# Patient Record
Sex: Female | Born: 1940
Health system: Southern US, Community
[De-identification: ages and names within clinical notes are randomized; demographics above are authoritative.]

## PROBLEM LIST (undated history)

## (undated) DIAGNOSIS — I10 Essential (primary) hypertension: Secondary | ICD-10-CM

## (undated) HISTORY — PX: CHOLECYSTECTOMY: SHX55

---

## 2017-08-24 ENCOUNTER — Encounter (HOSPITAL_BASED_OUTPATIENT_CLINIC_OR_DEPARTMENT_OTHER): Payer: Self-pay | Admitting: Emergency Medicine

## 2017-08-24 ENCOUNTER — Emergency Department (HOSPITAL_BASED_OUTPATIENT_CLINIC_OR_DEPARTMENT_OTHER)
Admission: EM | Admit: 2017-08-24 | Discharge: 2017-08-25 | Disposition: A | Discharge status: Home / Self Care | Payer: PPO | Attending: Emergency Medicine | Admitting: Emergency Medicine

## 2017-08-24 ENCOUNTER — Emergency Department (HOSPITAL_BASED_OUTPATIENT_CLINIC_OR_DEPARTMENT_OTHER): Payer: PPO

## 2017-08-24 DIAGNOSIS — S0031XA Abrasion of nose, initial encounter: Secondary | ICD-10-CM | POA: Insufficient documentation

## 2017-08-24 DIAGNOSIS — I10 Essential (primary) hypertension: Secondary | ICD-10-CM | POA: Diagnosis not present

## 2017-08-24 DIAGNOSIS — S0990XA Unspecified injury of head, initial encounter: Secondary | ICD-10-CM | POA: Diagnosis not present

## 2017-08-24 DIAGNOSIS — Z23 Encounter for immunization: Secondary | ICD-10-CM | POA: Insufficient documentation

## 2017-08-24 DIAGNOSIS — Y999 Unspecified external cause status: Secondary | ICD-10-CM | POA: Insufficient documentation

## 2017-08-24 DIAGNOSIS — S0993XA Unspecified injury of face, initial encounter: Secondary | ICD-10-CM | POA: Diagnosis not present

## 2017-08-24 DIAGNOSIS — Z79899 Other long term (current) drug therapy: Secondary | ICD-10-CM | POA: Diagnosis not present

## 2017-08-24 DIAGNOSIS — T148XXA Other injury of unspecified body region, initial encounter: Secondary | ICD-10-CM

## 2017-08-24 DIAGNOSIS — Y9222 Religious institution as the place of occurrence of the external cause: Secondary | ICD-10-CM | POA: Insufficient documentation

## 2017-08-24 DIAGNOSIS — Y9301 Activity, walking, marching and hiking: Secondary | ICD-10-CM | POA: Diagnosis not present

## 2017-08-24 DIAGNOSIS — W010XXA Fall on same level from slipping, tripping and stumbling without subsequent striking against object, initial encounter: Secondary | ICD-10-CM | POA: Insufficient documentation

## 2017-08-24 DIAGNOSIS — W19XXXA Unspecified fall, initial encounter: Secondary | ICD-10-CM

## 2017-08-24 HISTORY — DX: Essential (primary) hypertension: I10

## 2017-08-24 MED ORDER — TETANUS-DIPHTH-ACELL PERTUSSIS 5-2.5-18.5 LF-MCG/0.5 IM SUSP
0.5000 mL | Freq: Once | INTRAMUSCULAR | Status: AC
  Administered 2017-08-24: 0.5 mL via INTRAMUSCULAR
  Filled 2017-08-24: qty 0.5

## 2017-08-24 NOTE — ED Triage Notes (Signed)
Patient states that she fell earlier this AM  - she tripped on some rocks outside of church and hit her face and nose  - the patient reports that she hit her head as well. Patient is A/O x3

## 2017-08-24 NOTE — ED Provider Notes (Signed)
[facility]   CSN: [id number] Arrival date & time: 08/24/17  2100     History   Chief Complaint Chief Complaint  Patient presents with  . Fall    HPI Vickie Diaz is a 76 y.o. female.  HPI  Patient, with a past medical history of hypertension, presents to ED for evaluation ofpain after mechanical fall today. She was walking into church when she slipped on some rocks. She proceeded to complete her entire church service and then return to ED for evaluation. She denies any loss of consciousness. She denies any headache, blood thinners, vision changes, numbness in legs. She does report some soreness to her right leg where she landed.  Past Medical History:  Diagnosis Date  . Hypertension     There are no active problems to display for this patient.   Past Surgical History:  Procedure Laterality Date  . CHOLECYSTECTOMY      OB History    No data available       Home Medications    Prior to Admission medications   Medication Sig Start Date End Date Taking? Authorizing Provider  atenolol (TENORMIN) 100 MG tablet Take 100 mg by mouth daily.   Yes [provider]  hydrochlorothiazide (HYDRODIURIL) 25 MG tablet Take 25 mg by mouth daily.   Yes [provider]  simvastatin (ZOCOR) 20 MG tablet Take 20 mg by mouth daily.   Yes [provider]    Family History History reviewed. No pertinent family history.  Social History Social History  Substance Use Topics  . Smoking status: Never Smoker  . Smokeless tobacco: Never Used  . Alcohol use Not on file     Allergies   Patient has no known allergies.   Review of Systems Review of Systems  Constitutional: Negative for appetite change, chills and fever.  HENT: Negative for ear pain, rhinorrhea, sneezing and sore throat.   Eyes: Negative for photophobia and visual disturbance.  Respiratory: Negative for cough, chest tightness, shortness of breath and wheezing.    Cardiovascular: Negative for chest pain and palpitations.  Gastrointestinal: Negative for abdominal pain, blood in stool, constipation, diarrhea, nausea and vomiting.  Genitourinary: Negative for dysuria, hematuria and urgency.  Musculoskeletal: Positive for myalgias.  Skin: Positive for wound. Negative for rash.  Neurological: Negative for dizziness, weakness, light-headedness and numbness.     Physical Exam Updated Vital Signs BP (!) 160/82 (BP Location: Right Arm)   Pulse 99   Temp 98.5 F (36.9 C)   Resp 20   Ht  (1.6 m)   Wt 86.6 kg (191 lb)   SpO2 99%   BMI 33.83 kg/m   Physical Exam  Constitutional: She appears well-developed and well-nourished. No distress.  HENT:  Head: Normocephalic and atraumatic.  Nose: Nose normal.  Eyes: Pupils are equal, round, and reactive to light. Conjunctivae and EOM are normal. Right eye exhibits no discharge. Left eye exhibits no discharge. No scleral icterus.  Neck: Normal range of motion. Neck supple.  Cardiovascular: Normal rate, regular rhythm, normal heart sounds and intact distal pulses.  Exam reveals no gallop and no friction rub.   No murmur heard. Pulmonary/Chest: Effort normal and breath sounds normal. No respiratory distress.  Abdominal: Soft. Bowel sounds are normal. She exhibits no distension. There is no tenderness. There is no guarding.  Musculoskeletal: Normal range of motion. She exhibits no edema.  Neurological: She is alert. No cranial nerve deficit or sensory deficit. She exhibits normal  muscle tone. Coordination normal.  Pupils reactive. No facial asymmetry noted. Cranial nerves appear grossly intact. Sensation intact to light touch on face, BUE and BLE. Strength 5/5 in BUE and BLE. Normal finger to nose coordination.  Skin: Skin is warm and dry. Abrasion noted. No rash noted.  Abrasion noted to tip of nose. No active bleeding.  Psychiatric: She has a normal mood and affect.  Nursing note and vitals  reviewed.    ED Treatments / Results  Labs (all labs ordered are listed, but only abnormal results are displayed) Labs Reviewed - No data to display  EKG  EKG Interpretation None       Radiology Ct Head Wo Contrast  Result Date: 08/24/2017 CLINICAL DATA:  Fall earlier today hitting face and nose. EXAM: CT HEAD WITHOUT CONTRAST CT MAXILLOFACIAL WITHOUT CONTRAST TECHNIQUE: Multidetector CT imaging of the head and maxillofacial structures were performed using the standard protocol without intravenous contrast. Multiplanar CT image reconstructions of the maxillofacial structures were also generated. COMPARISON:  None. FINDINGS: CT HEAD FINDINGS Brain: No evidence of acute infarction, hemorrhage, hydrocephalus, extra-axial collection or mass lesion/mass effect. Vascular: No hyperdense vessel or unexpected calcification. Skull: Normal. Negative for fracture or focal lesion. Other: None. CT MAXILLOFACIAL FINDINGS Osseous: No fracture or mandibular dislocation. No destructive process. Orbits: Within normal. Sinuses: Paranasal sinuses are well developed and well aerated without air-fluid level or significant opacification. Deviation of the nasal septum to the left. Bilateral middle turbinate concha bullosa. Ostiomeatal complexes are patent. Mastoid air cells are patent. Soft tissues: Negative. IMPRESSION: No acute intracranial findings. No acute facial bone fracture. Electronically Signed   By: Elberta Fortis M.D.   On: 08/24/2017 23:13   Ct Maxillofacial Wo Contrast  Result Date: 08/24/2017 CLINICAL DATA:  Fall earlier today hitting face and nose. EXAM: CT HEAD WITHOUT CONTRAST CT MAXILLOFACIAL WITHOUT CONTRAST TECHNIQUE: Multidetector CT imaging of the head and maxillofacial structures were performed using the standard protocol without intravenous contrast. Multiplanar CT image reconstructions of the maxillofacial structures were also generated. COMPARISON:  None. FINDINGS: CT HEAD FINDINGS Brain: No  evidence of acute infarction, hemorrhage, hydrocephalus, extra-axial collection or mass lesion/mass effect. Vascular: No hyperdense vessel or unexpected calcification. Skull: Normal. Negative for fracture or focal lesion. Other: None. CT MAXILLOFACIAL FINDINGS Osseous: No fracture or mandibular dislocation. No destructive process. Orbits: Within normal. Sinuses: Paranasal sinuses are well developed and well aerated without air-fluid level or significant opacification. Deviation of the nasal septum to the left. Bilateral middle turbinate concha bullosa. Ostiomeatal complexes are patent. Mastoid air cells are patent. Soft tissues: Negative. IMPRESSION: No acute intracranial findings. No acute facial bone fracture. Electronically Signed   By: Elberta Fortis M.D.   On: 08/24/2017 23:13    Procedures Procedures (including critical care time)  Medications Ordered in ED Medications  Tdap (BOOSTRIX) injection 0.5 mL (0.5 mLs Intramuscular Given 08/24/17 2354)     Initial Impression / Assessment and Plan / ED Course  I have reviewed the triage vital signs and the nursing notes.  Pertinent labs & imaging results that were available during my care of the patient were reviewed by me and considered in my medical decision making (see chart for details).     Patient presents to ED for evaluation of mechanical fall that occurred prior to arrival. She was walking to church when she tripped and fell on some rocks. She landed on the right side of her head. She denies any loss of consciousness. She denies any current headache, vision  changes, numbness in legs, trouble walking. She has been ambulatory with normal gait here in the ED. She does have some superficial abrasions noted to the tip of her nose but otherwise appears in good shape. She has no deficits on neurological exam. CT of brain and maxillofacial returned as negative for acute abnormality. Tetanus updated here in the ED. Advised patient to follow up with  PCP for further evaluation. Patient appears stable for discharge at this time. Strict return precautions given.  Patient discussed with and seen by Dr. Adela Lank.  Final Clinical Impressions(s) / ED Diagnoses   Final diagnoses:  Fall, initial encounter  Abrasion    New Prescriptions Discharge Medication List as of 08/24/2017 11:49 PM       Dietrich Pates, PA-C 08/25/17 0106    Melene Plan, DO 08/25/17 1506    Melene Plan, DO 08/25/17 1614

## 2017-08-24 NOTE — Discharge Instructions (Signed)
Follow up with PCP for further evaluation. Return to ED for worsening pain, headache, vision changes, numbness in legs, additional falls, vomiting.

## 2017-09-20 DIAGNOSIS — G250 Essential tremor: Secondary | ICD-10-CM | POA: Diagnosis not present

## 2017-09-20 DIAGNOSIS — E782 Mixed hyperlipidemia: Secondary | ICD-10-CM | POA: Diagnosis not present

## 2017-09-20 DIAGNOSIS — R7309 Other abnormal glucose: Secondary | ICD-10-CM | POA: Diagnosis not present

## 2017-09-20 DIAGNOSIS — I10 Essential (primary) hypertension: Secondary | ICD-10-CM | POA: Diagnosis not present

## 2017-09-20 DIAGNOSIS — Z23 Encounter for immunization: Secondary | ICD-10-CM | POA: Diagnosis not present

## 2017-10-14 DIAGNOSIS — E118 Type 2 diabetes mellitus with unspecified complications: Secondary | ICD-10-CM | POA: Diagnosis not present

## 2017-10-24 DIAGNOSIS — G250 Essential tremor: Secondary | ICD-10-CM | POA: Diagnosis not present

## 2017-10-24 DIAGNOSIS — I129 Hypertensive chronic kidney disease with stage 1 through stage 4 chronic kidney disease, or unspecified chronic kidney disease: Secondary | ICD-10-CM | POA: Diagnosis not present

## 2017-10-24 DIAGNOSIS — B86 Scabies: Secondary | ICD-10-CM | POA: Diagnosis not present

## 2017-10-24 DIAGNOSIS — S39012A Strain of muscle, fascia and tendon of lower back, initial encounter: Secondary | ICD-10-CM | POA: Diagnosis not present

## 2017-12-20 DIAGNOSIS — I129 Hypertensive chronic kidney disease with stage 1 through stage 4 chronic kidney disease, or unspecified chronic kidney disease: Secondary | ICD-10-CM | POA: Diagnosis not present

## 2017-12-20 DIAGNOSIS — N182 Chronic kidney disease, stage 2 (mild): Secondary | ICD-10-CM | POA: Diagnosis not present

## 2017-12-20 DIAGNOSIS — E782 Mixed hyperlipidemia: Secondary | ICD-10-CM | POA: Diagnosis not present

## 2017-12-20 DIAGNOSIS — E1122 Type 2 diabetes mellitus with diabetic chronic kidney disease: Secondary | ICD-10-CM | POA: Diagnosis not present

## 2017-12-20 DIAGNOSIS — G250 Essential tremor: Secondary | ICD-10-CM | POA: Diagnosis not present

## 2018-01-12 DIAGNOSIS — H04123 Dry eye syndrome of bilateral lacrimal glands: Secondary | ICD-10-CM | POA: Diagnosis not present

## 2018-01-12 DIAGNOSIS — H2513 Age-related nuclear cataract, bilateral: Secondary | ICD-10-CM | POA: Diagnosis not present

## 2018-01-12 DIAGNOSIS — H5203 Hypermetropia, bilateral: Secondary | ICD-10-CM | POA: Diagnosis not present

## 2018-01-12 DIAGNOSIS — E119 Type 2 diabetes mellitus without complications: Secondary | ICD-10-CM | POA: Diagnosis not present

## 2018-05-27 DIAGNOSIS — N182 Chronic kidney disease, stage 2 (mild): Secondary | ICD-10-CM | POA: Diagnosis not present

## 2018-05-27 DIAGNOSIS — I129 Hypertensive chronic kidney disease with stage 1 through stage 4 chronic kidney disease, or unspecified chronic kidney disease: Secondary | ICD-10-CM | POA: Diagnosis not present

## 2018-05-27 DIAGNOSIS — E1122 Type 2 diabetes mellitus with diabetic chronic kidney disease: Secondary | ICD-10-CM | POA: Diagnosis not present

## 2018-05-27 DIAGNOSIS — E782 Mixed hyperlipidemia: Secondary | ICD-10-CM | POA: Diagnosis not present

## 2018-05-31 IMAGING — CT CT HEAD W/O CM
3 of 8 series · 15 of 47 positions shown, 18 images · non-contrast
Comparison: None.

CLINICAL DATA: Fall earlier today hitting face and nose.

EXAM:
CT HEAD WITHOUT CONTRAST
CT MAXILLOFACIAL WITHOUT CONTRAST
TECHNIQUE: Multidetector CT imaging of the head and maxillofacial structures
were performed using the standard protocol without intravenous
contrast. Multiplanar CT image reconstructions of the maxillofacial
structures were also generated.

[Series 6: max soft · axial · 0.32mm/px · z∈[+1232,+1358]mm · 11 of 77 slices shown, 14 images]
[im 7/77  brain]
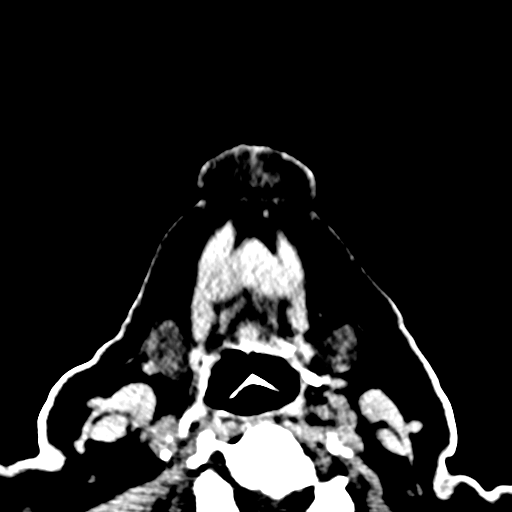
[im 7/77  bone]
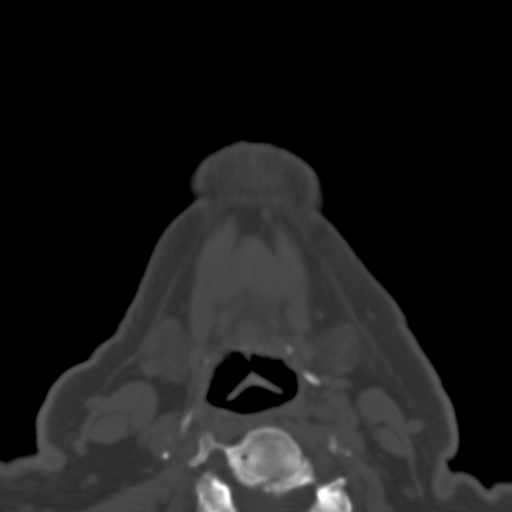
[im 13/77  brain]
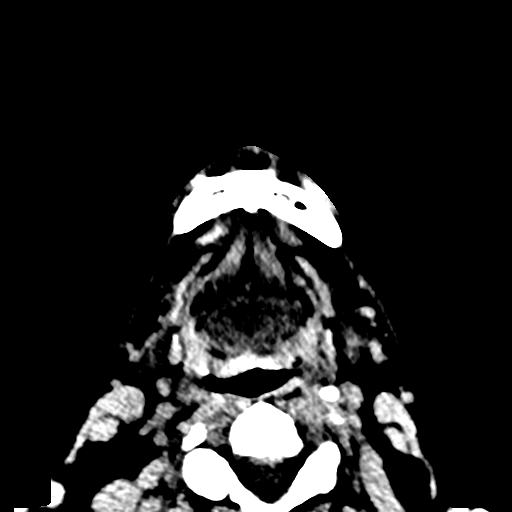
[im 20/77  brain]
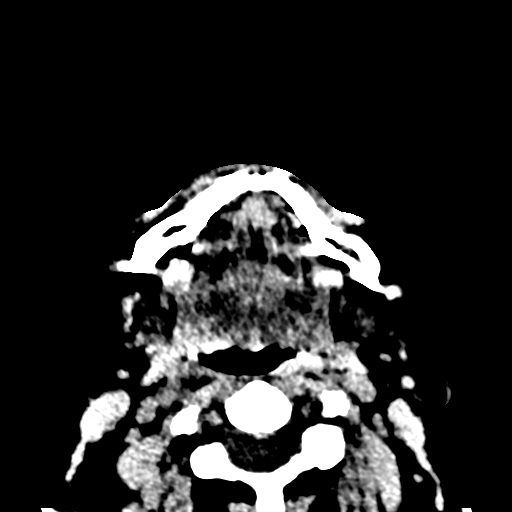
[im 26/77  brain]
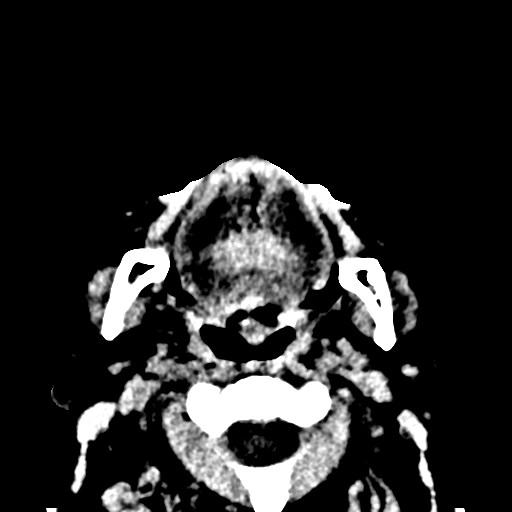
[im 32/77  brain]
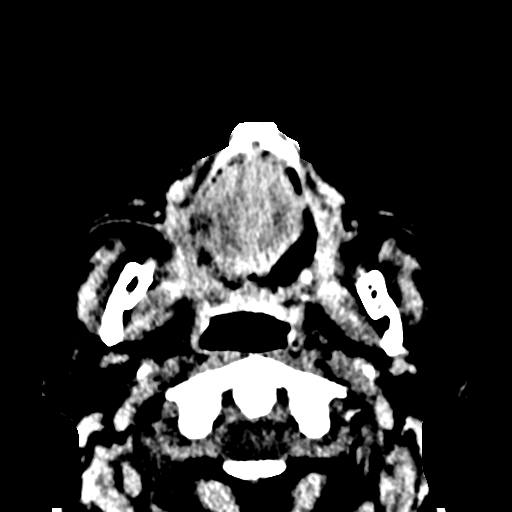
[im 32/77  bone]
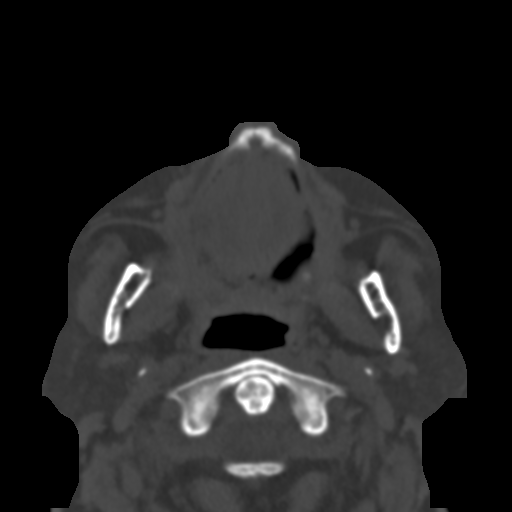
[im 39/77  brain]
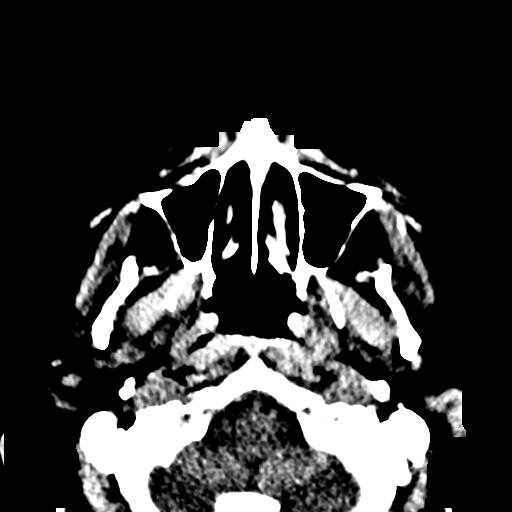
[im 45/77  brain]
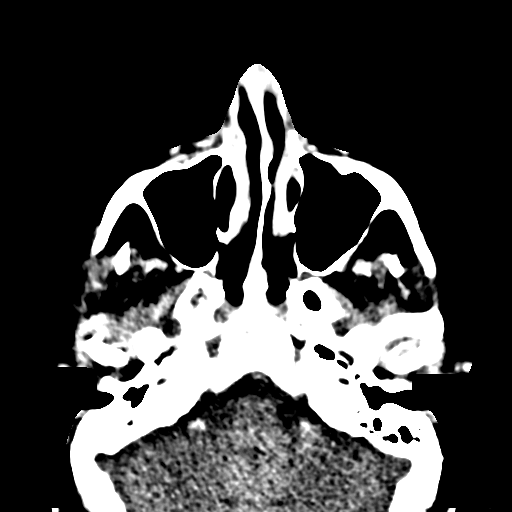
[im 51/77  brain]
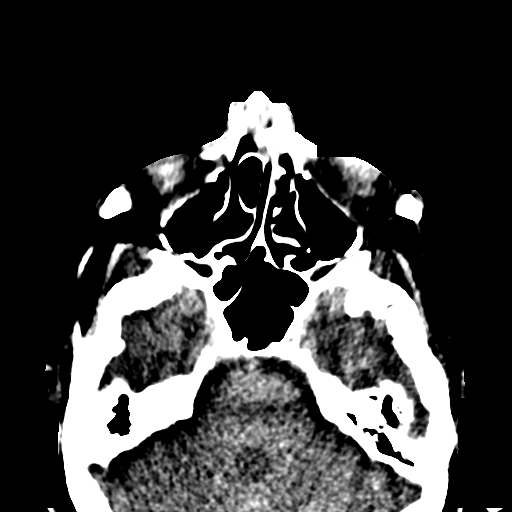
[im 58/77  brain]
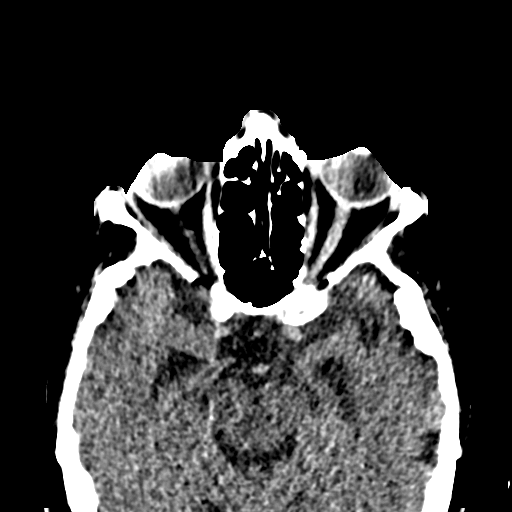
[im 58/77  bone]
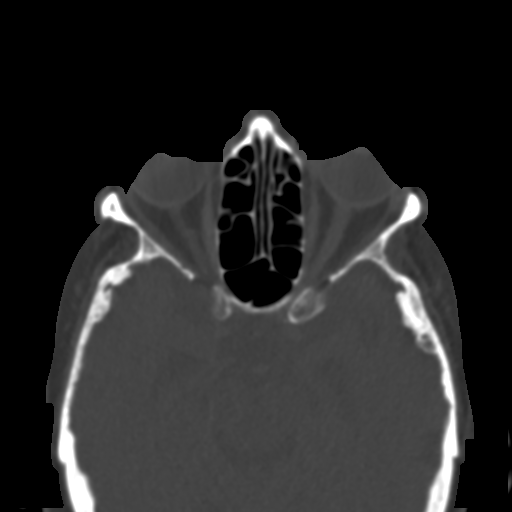
[im 64/77  brain]
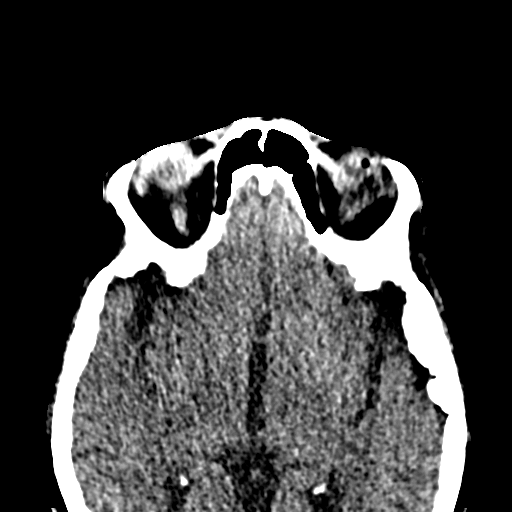
[im 70/77  brain]
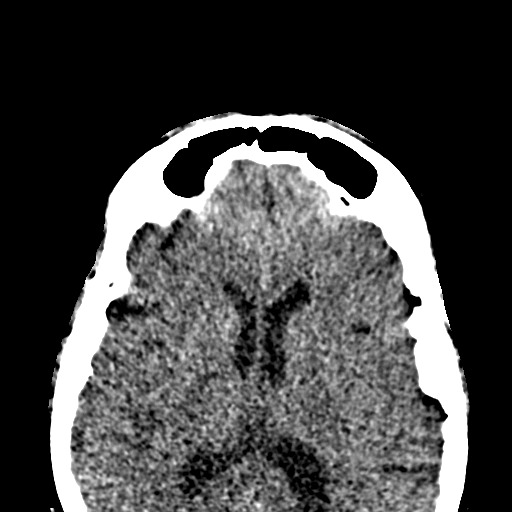

[Series 8: coronal soft · coronal · 0.34mm/px · 3 of 75 slices shown]
[im 19/75  brain]
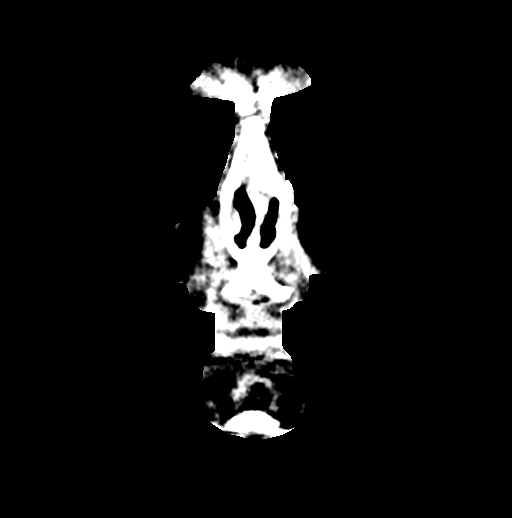
[im 38/75  brain]
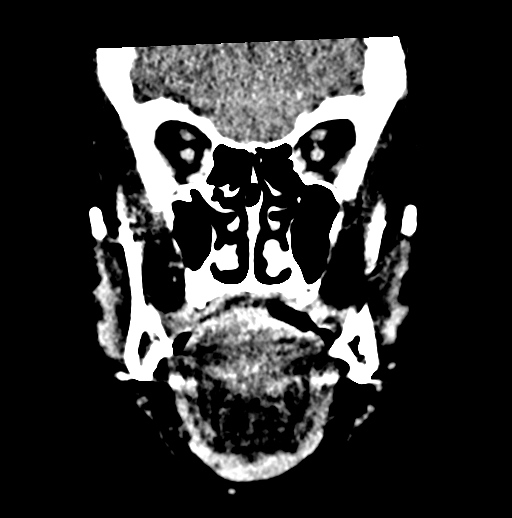
[im 56/75  brain]
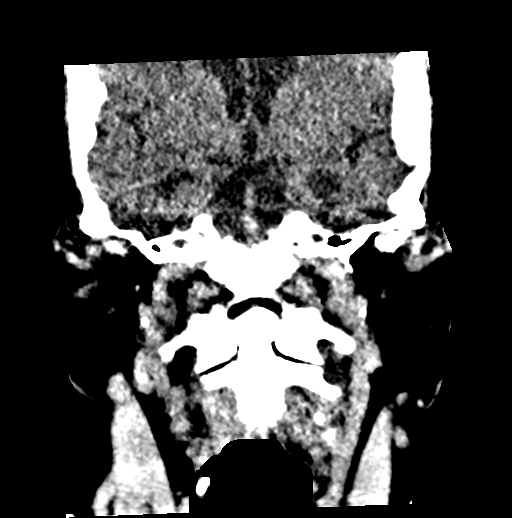

[Series 9: sagittal soft · sagittal · 0.32mm/px · 1 of 81 slices shown]
[im 41/81  brain]
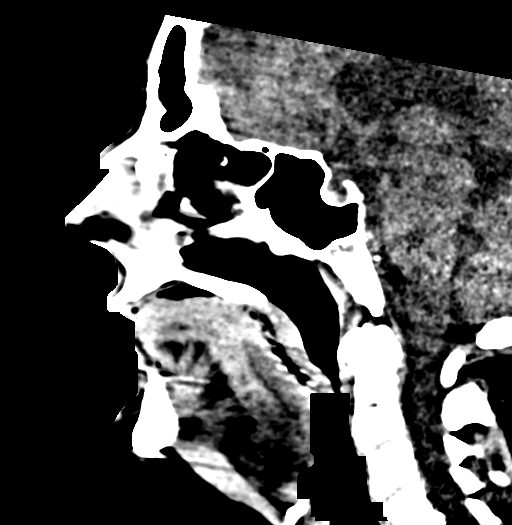

[15 of 47 positions shown; findings below may reference images not displayed]

FINDINGS: CT HEAD FINDINGS

Brain: No evidence of acute infarction, hemorrhage, hydrocephalus,
extra-axial collection or mass lesion/mass effect.

Vascular: No hyperdense vessel or unexpected calcification.

Skull: Normal. Negative for fracture or focal lesion.

Other: None.

CT MAXILLOFACIAL FINDINGS

Osseous: No fracture or mandibular dislocation. No destructive
process.

Orbits: Within normal.

Sinuses: Paranasal sinuses are well developed and well aerated
without air-fluid level or significant opacification. Deviation of
the nasal septum to the left. Bilateral middle turbinate concha
bullosa. Ostiomeatal complexes are patent. Mastoid air cells are
patent.

Soft tissues: Negative.
IMPRESSION: No acute intracranial findings.

No acute facial bone fracture.

## 2018-08-10 DIAGNOSIS — H1033 Unspecified acute conjunctivitis, bilateral: Secondary | ICD-10-CM | POA: Diagnosis not present

## 2018-09-18 DIAGNOSIS — E669 Obesity, unspecified: Secondary | ICD-10-CM | POA: Diagnosis not present

## 2018-09-18 DIAGNOSIS — G250 Essential tremor: Secondary | ICD-10-CM | POA: Diagnosis not present

## 2018-09-18 DIAGNOSIS — Z23 Encounter for immunization: Secondary | ICD-10-CM | POA: Diagnosis not present

## 2018-09-18 DIAGNOSIS — E782 Mixed hyperlipidemia: Secondary | ICD-10-CM | POA: Diagnosis not present

## 2018-09-18 DIAGNOSIS — N182 Chronic kidney disease, stage 2 (mild): Secondary | ICD-10-CM | POA: Diagnosis not present

## 2018-09-18 DIAGNOSIS — E1122 Type 2 diabetes mellitus with diabetic chronic kidney disease: Secondary | ICD-10-CM | POA: Diagnosis not present

## 2018-09-18 DIAGNOSIS — Z6835 Body mass index (BMI) 35.0-35.9, adult: Secondary | ICD-10-CM | POA: Diagnosis not present

## 2018-09-18 DIAGNOSIS — I129 Hypertensive chronic kidney disease with stage 1 through stage 4 chronic kidney disease, or unspecified chronic kidney disease: Secondary | ICD-10-CM | POA: Diagnosis not present

## 2018-12-08 DIAGNOSIS — E1122 Type 2 diabetes mellitus with diabetic chronic kidney disease: Secondary | ICD-10-CM | POA: Diagnosis not present

## 2018-12-08 DIAGNOSIS — N182 Chronic kidney disease, stage 2 (mild): Secondary | ICD-10-CM | POA: Diagnosis not present

## 2018-12-12 DIAGNOSIS — I129 Hypertensive chronic kidney disease with stage 1 through stage 4 chronic kidney disease, or unspecified chronic kidney disease: Secondary | ICD-10-CM | POA: Diagnosis not present

## 2018-12-12 DIAGNOSIS — M2041 Other hammer toe(s) (acquired), right foot: Secondary | ICD-10-CM | POA: Diagnosis not present

## 2018-12-12 DIAGNOSIS — N182 Chronic kidney disease, stage 2 (mild): Secondary | ICD-10-CM | POA: Diagnosis not present

## 2018-12-12 DIAGNOSIS — E1122 Type 2 diabetes mellitus with diabetic chronic kidney disease: Secondary | ICD-10-CM | POA: Diagnosis not present

## 2018-12-12 DIAGNOSIS — R480 Dyslexia and alexia: Secondary | ICD-10-CM | POA: Diagnosis not present

## 2018-12-12 DIAGNOSIS — E782 Mixed hyperlipidemia: Secondary | ICD-10-CM | POA: Diagnosis not present

## 2018-12-12 DIAGNOSIS — Z7984 Long term (current) use of oral hypoglycemic drugs: Secondary | ICD-10-CM | POA: Diagnosis not present

## 2018-12-12 DIAGNOSIS — M2042 Other hammer toe(s) (acquired), left foot: Secondary | ICD-10-CM | POA: Diagnosis not present

## 2019-03-20 DIAGNOSIS — I129 Hypertensive chronic kidney disease with stage 1 through stage 4 chronic kidney disease, or unspecified chronic kidney disease: Secondary | ICD-10-CM | POA: Diagnosis not present

## 2019-03-20 DIAGNOSIS — E1122 Type 2 diabetes mellitus with diabetic chronic kidney disease: Secondary | ICD-10-CM | POA: Diagnosis not present

## 2019-03-20 DIAGNOSIS — E782 Mixed hyperlipidemia: Secondary | ICD-10-CM | POA: Diagnosis not present

## 2019-03-20 DIAGNOSIS — N182 Chronic kidney disease, stage 2 (mild): Secondary | ICD-10-CM | POA: Diagnosis not present

## 2019-09-11 DIAGNOSIS — R480 Dyslexia and alexia: Secondary | ICD-10-CM | POA: Diagnosis not present

## 2019-09-11 DIAGNOSIS — Z23 Encounter for immunization: Secondary | ICD-10-CM | POA: Diagnosis not present

## 2019-09-11 DIAGNOSIS — I129 Hypertensive chronic kidney disease with stage 1 through stage 4 chronic kidney disease, or unspecified chronic kidney disease: Secondary | ICD-10-CM | POA: Diagnosis not present

## 2019-09-11 DIAGNOSIS — I10 Essential (primary) hypertension: Secondary | ICD-10-CM | POA: Diagnosis not present

## 2019-09-11 DIAGNOSIS — E782 Mixed hyperlipidemia: Secondary | ICD-10-CM | POA: Diagnosis not present

## 2019-09-11 DIAGNOSIS — N182 Chronic kidney disease, stage 2 (mild): Secondary | ICD-10-CM | POA: Diagnosis not present

## 2019-09-11 DIAGNOSIS — R413 Other amnesia: Secondary | ICD-10-CM | POA: Diagnosis not present

## 2019-09-11 DIAGNOSIS — E1122 Type 2 diabetes mellitus with diabetic chronic kidney disease: Secondary | ICD-10-CM | POA: Diagnosis not present

## 2020-02-07 DIAGNOSIS — Z20822 Contact with and (suspected) exposure to covid-19: Secondary | ICD-10-CM | POA: Diagnosis not present

## 2020-03-09 DIAGNOSIS — R413 Other amnesia: Secondary | ICD-10-CM | POA: Diagnosis not present

## 2020-04-23 DIAGNOSIS — Z6835 Body mass index (BMI) 35.0-35.9, adult: Secondary | ICD-10-CM | POA: Diagnosis not present

## 2020-04-23 DIAGNOSIS — E6609 Other obesity due to excess calories: Secondary | ICD-10-CM | POA: Diagnosis not present

## 2020-04-23 DIAGNOSIS — N182 Chronic kidney disease, stage 2 (mild): Secondary | ICD-10-CM | POA: Diagnosis not present

## 2020-04-23 DIAGNOSIS — E1122 Type 2 diabetes mellitus with diabetic chronic kidney disease: Secondary | ICD-10-CM | POA: Diagnosis not present

## 2020-04-23 DIAGNOSIS — E538 Deficiency of other specified B group vitamins: Secondary | ICD-10-CM | POA: Diagnosis not present

## 2020-04-23 DIAGNOSIS — E782 Mixed hyperlipidemia: Secondary | ICD-10-CM | POA: Diagnosis not present

## 2020-04-23 DIAGNOSIS — I129 Hypertensive chronic kidney disease with stage 1 through stage 4 chronic kidney disease, or unspecified chronic kidney disease: Secondary | ICD-10-CM | POA: Diagnosis not present

## 2020-04-23 DIAGNOSIS — G250 Essential tremor: Secondary | ICD-10-CM | POA: Diagnosis not present

## 2020-06-26 DIAGNOSIS — R413 Other amnesia: Secondary | ICD-10-CM | POA: Diagnosis not present

## 2020-06-26 DIAGNOSIS — G250 Essential tremor: Secondary | ICD-10-CM | POA: Diagnosis not present

## 2020-10-03 DIAGNOSIS — G301 Alzheimer's disease with late onset: Secondary | ICD-10-CM | POA: Diagnosis not present

## 2020-10-03 DIAGNOSIS — F0280 Dementia in other diseases classified elsewhere without behavioral disturbance: Secondary | ICD-10-CM | POA: Diagnosis not present

## 2020-10-07 DIAGNOSIS — E782 Mixed hyperlipidemia: Secondary | ICD-10-CM | POA: Diagnosis not present

## 2020-10-07 DIAGNOSIS — G250 Essential tremor: Secondary | ICD-10-CM | POA: Diagnosis not present

## 2020-10-07 DIAGNOSIS — N182 Chronic kidney disease, stage 2 (mild): Secondary | ICD-10-CM | POA: Diagnosis not present

## 2020-10-07 DIAGNOSIS — E538 Deficiency of other specified B group vitamins: Secondary | ICD-10-CM | POA: Diagnosis not present

## 2020-10-07 DIAGNOSIS — I129 Hypertensive chronic kidney disease with stage 1 through stage 4 chronic kidney disease, or unspecified chronic kidney disease: Secondary | ICD-10-CM | POA: Diagnosis not present

## 2020-10-07 DIAGNOSIS — Z23 Encounter for immunization: Secondary | ICD-10-CM | POA: Diagnosis not present

## 2020-10-07 DIAGNOSIS — E1122 Type 2 diabetes mellitus with diabetic chronic kidney disease: Secondary | ICD-10-CM | POA: Diagnosis not present

## 2021-01-24 DIAGNOSIS — F0390 Unspecified dementia without behavioral disturbance: Secondary | ICD-10-CM | POA: Diagnosis not present

## 2021-01-24 DIAGNOSIS — G319 Degenerative disease of nervous system, unspecified: Secondary | ICD-10-CM | POA: Diagnosis not present

## 2021-01-24 DIAGNOSIS — R413 Other amnesia: Secondary | ICD-10-CM | POA: Diagnosis not present

## 2021-03-05 DIAGNOSIS — N182 Chronic kidney disease, stage 2 (mild): Secondary | ICD-10-CM | POA: Diagnosis not present

## 2021-03-05 DIAGNOSIS — E1122 Type 2 diabetes mellitus with diabetic chronic kidney disease: Secondary | ICD-10-CM | POA: Diagnosis not present

## 2021-03-05 DIAGNOSIS — M2041 Other hammer toe(s) (acquired), right foot: Secondary | ICD-10-CM | POA: Diagnosis not present

## 2021-03-05 DIAGNOSIS — M2042 Other hammer toe(s) (acquired), left foot: Secondary | ICD-10-CM | POA: Diagnosis not present

## 2021-06-05 DIAGNOSIS — K529 Noninfective gastroenteritis and colitis, unspecified: Secondary | ICD-10-CM | POA: Diagnosis not present

## 2021-08-02 DIAGNOSIS — N182 Chronic kidney disease, stage 2 (mild): Secondary | ICD-10-CM | POA: Diagnosis not present

## 2021-08-02 DIAGNOSIS — I10 Essential (primary) hypertension: Secondary | ICD-10-CM | POA: Diagnosis not present

## 2021-08-02 DIAGNOSIS — E1122 Type 2 diabetes mellitus with diabetic chronic kidney disease: Secondary | ICD-10-CM | POA: Diagnosis not present

## 2021-08-02 DIAGNOSIS — I129 Hypertensive chronic kidney disease with stage 1 through stage 4 chronic kidney disease, or unspecified chronic kidney disease: Secondary | ICD-10-CM | POA: Diagnosis not present

## 2021-08-02 DIAGNOSIS — G301 Alzheimer's disease with late onset: Secondary | ICD-10-CM | POA: Diagnosis not present

## 2021-08-02 DIAGNOSIS — L84 Corns and callosities: Secondary | ICD-10-CM | POA: Diagnosis not present

## 2021-08-02 DIAGNOSIS — G629 Polyneuropathy, unspecified: Secondary | ICD-10-CM | POA: Diagnosis not present

## 2021-08-02 DIAGNOSIS — E538 Deficiency of other specified B group vitamins: Secondary | ICD-10-CM | POA: Diagnosis not present

## 2021-08-02 DIAGNOSIS — M2041 Other hammer toe(s) (acquired), right foot: Secondary | ICD-10-CM | POA: Diagnosis not present

## 2021-08-02 DIAGNOSIS — M2042 Other hammer toe(s) (acquired), left foot: Secondary | ICD-10-CM | POA: Diagnosis not present

## 2021-08-02 DIAGNOSIS — Z23 Encounter for immunization: Secondary | ICD-10-CM | POA: Diagnosis not present

## 2021-08-02 DIAGNOSIS — F0280 Dementia in other diseases classified elsewhere without behavioral disturbance: Secondary | ICD-10-CM | POA: Diagnosis not present

## 2021-09-04 DIAGNOSIS — N390 Urinary tract infection, site not specified: Secondary | ICD-10-CM | POA: Diagnosis not present

## 2021-09-04 DIAGNOSIS — B9620 Unspecified Escherichia coli [E. coli] as the cause of diseases classified elsewhere: Secondary | ICD-10-CM | POA: Diagnosis not present

## 2021-09-04 DIAGNOSIS — R197 Diarrhea, unspecified: Secondary | ICD-10-CM | POA: Diagnosis not present

## 2021-09-04 DIAGNOSIS — Z20822 Contact with and (suspected) exposure to covid-19: Secondary | ICD-10-CM | POA: Diagnosis not present

## 2021-09-04 DIAGNOSIS — R051 Acute cough: Secondary | ICD-10-CM | POA: Diagnosis not present

## 2021-09-04 DIAGNOSIS — R112 Nausea with vomiting, unspecified: Secondary | ICD-10-CM | POA: Diagnosis not present

## 2021-09-07 DIAGNOSIS — N3090 Cystitis, unspecified without hematuria: Secondary | ICD-10-CM | POA: Diagnosis not present

## 2021-09-30 DIAGNOSIS — I452 Bifascicular block: Secondary | ICD-10-CM | POA: Diagnosis not present

## 2021-09-30 DIAGNOSIS — W19XXXA Unspecified fall, initial encounter: Secondary | ICD-10-CM | POA: Diagnosis not present

## 2021-09-30 DIAGNOSIS — S199XXA Unspecified injury of neck, initial encounter: Secondary | ICD-10-CM | POA: Diagnosis not present

## 2021-09-30 DIAGNOSIS — M50321 Other cervical disc degeneration at C4-C5 level: Secondary | ICD-10-CM | POA: Diagnosis not present

## 2021-09-30 DIAGNOSIS — M419 Scoliosis, unspecified: Secondary | ICD-10-CM | POA: Diagnosis not present

## 2021-09-30 DIAGNOSIS — S0990XA Unspecified injury of head, initial encounter: Secondary | ICD-10-CM | POA: Diagnosis not present

## 2021-09-30 DIAGNOSIS — E041 Nontoxic single thyroid nodule: Secondary | ICD-10-CM | POA: Diagnosis not present

## 2021-09-30 DIAGNOSIS — J111 Influenza due to unidentified influenza virus with other respiratory manifestations: Secondary | ICD-10-CM | POA: Diagnosis not present

## 2021-09-30 DIAGNOSIS — Z20822 Contact with and (suspected) exposure to covid-19: Secondary | ICD-10-CM | POA: Diagnosis not present

## 2021-09-30 DIAGNOSIS — R531 Weakness: Secondary | ICD-10-CM | POA: Diagnosis not present

## 2021-09-30 DIAGNOSIS — E860 Dehydration: Secondary | ICD-10-CM | POA: Diagnosis not present

## 2021-09-30 DIAGNOSIS — R930 Abnormal findings on diagnostic imaging of skull and head, not elsewhere classified: Secondary | ICD-10-CM | POA: Diagnosis not present

## 2021-09-30 DIAGNOSIS — J9811 Atelectasis: Secondary | ICD-10-CM | POA: Diagnosis not present

## 2021-09-30 DIAGNOSIS — R7989 Other specified abnormal findings of blood chemistry: Secondary | ICD-10-CM | POA: Diagnosis not present

## 2021-09-30 DIAGNOSIS — J101 Influenza due to other identified influenza virus with other respiratory manifestations: Secondary | ICD-10-CM | POA: Diagnosis not present

## 2021-09-30 DIAGNOSIS — R059 Cough, unspecified: Secondary | ICD-10-CM | POA: Diagnosis not present

## 2021-09-30 DIAGNOSIS — G319 Degenerative disease of nervous system, unspecified: Secondary | ICD-10-CM | POA: Diagnosis not present

## 2021-09-30 DIAGNOSIS — Z043 Encounter for examination and observation following other accident: Secondary | ICD-10-CM | POA: Diagnosis not present

## 2021-10-01 DIAGNOSIS — I4510 Unspecified right bundle-branch block: Secondary | ICD-10-CM | POA: Diagnosis not present

## 2021-10-01 DIAGNOSIS — I444 Left anterior fascicular block: Secondary | ICD-10-CM | POA: Diagnosis not present

## 2021-10-12 DIAGNOSIS — H6123 Impacted cerumen, bilateral: Secondary | ICD-10-CM | POA: Diagnosis not present

## 2021-10-12 DIAGNOSIS — H9203 Otalgia, bilateral: Secondary | ICD-10-CM | POA: Diagnosis not present

## 2021-10-29 DIAGNOSIS — R413 Other amnesia: Secondary | ICD-10-CM | POA: Diagnosis not present
